# Patient Record
Sex: Female | Born: 1990 | Race: Black or African American | Hispanic: No | Marital: Married | Smoking: Never smoker
Health system: Southern US, Community
[De-identification: ages and names within clinical notes are randomized; demographics above are authoritative.]

## PROBLEM LIST (undated history)

## (undated) HISTORY — PX: LAPAROSCOPIC OOPHERECTOMY: SHX6507

---

## 2015-12-04 ENCOUNTER — Emergency Department
Admission: EM | Admit: 2015-12-04 | Discharge: 2015-12-04 | Disposition: A | Discharge status: Home / Self Care | Payer: Self-pay | Attending: Emergency Medicine | Admitting: Emergency Medicine

## 2015-12-04 ENCOUNTER — Encounter: Payer: Self-pay | Admitting: Emergency Medicine

## 2015-12-04 DIAGNOSIS — J029 Acute pharyngitis, unspecified: Secondary | ICD-10-CM | POA: Insufficient documentation

## 2015-12-04 LAB — POCT RAPID STREP A: Streptococcus, Group A Screen (Direct): NEGATIVE

## 2015-12-04 MED ORDER — IBUPROFEN 400 MG PO TABS
400.0000 mg | ORAL_TABLET | Freq: Once | ORAL | Status: AC
  Administered 2015-12-04: 400 mg via ORAL

## 2015-12-04 MED ORDER — IBUPROFEN 400 MG PO TABS: 400.0000 mg | ORAL_TABLET | Freq: Four times a day (QID) | ORAL | Status: DC | PRN

## 2015-12-04 MED ORDER — IBUPROFEN 400 MG PO TABS
ORAL_TABLET | ORAL | Status: AC
  Administered 2015-12-04: 400 mg via ORAL
  Filled 2015-12-04: qty 1

## 2015-12-04 NOTE — ED Notes (Addendum)
Patient ambulatory to triage with steady gait, without difficulty or distress noted; pt reports sore throat, chills today

## 2015-12-04 NOTE — ED Provider Notes (Signed)
Time Seen: Approximately 0 500  I have reviewed the triage notes  Chief Complaint: Sore Throat   History of Present Illness: Jennifer Wells is a 25 y.o. female who states she was at work this evening at the [employer] plant when she noticed some feelings of sore throat, chills, generalized body aches. She denies any nausea, vomiting, shortness of breath or productive cough. She states she had a subjective fever yesterday. Patient states there is a lot of flu going around her household etc. She's had a mild frontal headache without any blurred vision and loss of vision. She states it hurts to swallow and hurts to speak. He denies any pain in the neck region.   History reviewed. No pertinent past medical history.  There are no active problems to display for this patient.   Past Surgical History  Procedure Laterality Date  . Laparoscopic oopherectomy      Past Surgical History  Procedure Laterality Date  . Laparoscopic oopherectomy      Current Outpatient Rx  Name  Route  Sig  Dispense  Refill  . ibuprofen (ADVIL,MOTRIN) 400 MG tablet   Oral   Take 1 tablet (400 mg total) by mouth every 6 (six) hours as needed.   30 tablet   0     Allergies:  Zithromax  Family History: No family history on file.  Social History: Social History  Substance Use Topics  . Smoking status: Never Smoker   . Smokeless tobacco: None  . Alcohol Use: No     Review of Systems:   Constitutional: No current fever Eyes: No visual disturbances ENT: Sore throat on both sides with radiation up into both ears Cardiac: No chest pain Respiratory: No shortness of breath, wheezing, or stridor Abdomen: No abdominal pain, no vomiting, No diarrhea Extremities: No peripheral edema, cyanosis Skin: No rashes, easy bruising Neurologic: No focal weakness, trouble with speech or swollowing Urologic: No dysuria, Hematuria, or urinary frequency   Physical Exam:  ED Triage Vitals  Enc Vitals Group   BP 12/04/15 0157 122/57 mmHg     Pulse Rate 12/04/15 0157 66     Resp 12/04/15 0157 20     Temp 12/04/15 0157 98 F (36.7 C)     Temp Source 12/04/15 0157 Oral     SpO2 12/04/15 0157 98 %     Weight 12/04/15 0157 265 lb (120.203 kg)     Height 12/04/15 0157  (1.676 m)     Head Cir --      Peak Flow --      Pain Score 12/04/15 0156 8     Pain Loc --      Pain Edu? --      Excl. in GC? --     General: Awake , Alert , and Oriented times 3; GCS 15 patient speaks in a soft voice without any stridor or wheezing at the bedside no signs of respiratory distress Head: Normal cephalic , atraumatic Eyes: Pupils equal , round, reactive to light Nose/Throat: No nasal drainage, patent upper airway with some mild erythema across the soft palate with no visible abscesses more swollen tonsillar region. Uvula is midline. TMs are negative bilaterally for erythema, exudate  Neck: Supple, Full range of motion, No anterior adenopathy or palpable thyroid masses Lungs: Clear to ascultation without wheezes , rhonchi, or rales Heart: Regular rate, regular rhythm without murmurs , gallops , or rubs Abdomen: Soft, non tender without rebound, guarding , or rigidity; bowel sounds positive  and symmetric in all 4 quadrants. No organomegaly .        Extremities: 2 plus symmetric pulses. No edema, clubbing or cyanosis Neurologic: normal ambulation, Skin: warm, dry, no rashes   Labs:   All laboratory work was reviewed including any pertinent negatives or positives listed below:  Labs Reviewed  CULTURE, GROUP A STREP [facility])  POCT RAPID STREP A   rapid strep test is negative with stool culture pending ED Course: Patient appears to have an upper respiratory tract infection. She may have developing influenza. She is otherwise stable and I felt did not have any meningitis or any life-threatening cause for her presentation at this time. She was given prescription for ibuprofen and a work note and advised drink  plenty of fluids and that we would contact her for strep culture is positive. To have any signs consistent with mononucleosis.    Assessment: * Pharyngitis  Final Clinical Impression:   Final diagnoses:  Pharyngitis     Plan:  Outpatient management Patient was advised to return immediately if condition worsens. Patient was advised to follow up with their primary care physician or other specialized physicians involved in their outpatient care            Jennye Moccasin, MD 12/04/15 [phone]

## 2015-12-04 NOTE — Discharge Instructions (Signed)
Sore Throat A sore throat is a painful, burning, sore, or scratchy feeling of the throat. There may be pain or tenderness when swallowing or talking. You may have other symptoms with a sore throat. These include coughing, sneezing, fever, or a swollen neck. A sore throat is often the first sign of another sickness. These sicknesses may include a cold, flu, strep throat, or an infection called mono. Most sore throats go away without medical treatment.  HOME CARE   Only take medicine as told by your doctor.  Drink enough fluids to keep your pee (urine) clear or pale yellow.  Rest as needed.  Try using throat sprays, lozenges, or suck on hard candy (if older than 4 years or as told).  Sip warm liquids, such as broth, herbal tea, or warm water with honey. Try sucking on frozen ice pops or drinking cold liquids.  Rinse the mouth (gargle) with salt water. Mix 1 teaspoon salt with 8 ounces of water.  Do not smoke. Avoid being around others when they are smoking.  Put a humidifier in your bedroom at night to moisten the air. You can also turn on a hot shower and sit in the bathroom for 5-10 minutes. Be sure the bathroom door is closed. GET HELP RIGHT AWAY IF:   You have trouble breathing.  You cannot swallow fluids, soft foods, or your spit (saliva).  You have more puffiness (swelling) in the throat.  Your sore throat does not get better in 7 days.  You feel sick to your stomach (nauseous) and throw up (vomit).  You have a fever or lasting symptoms for more than 2-3 days.  You have a fever and your symptoms suddenly get worse. MAKE SURE YOU:   Understand these instructions.  Will watch your condition.  Will get help right away if you are not doing well or get worse.   This information is not intended to replace advice given to you by your health care provider. Make sure you discuss any questions you have with your health care provider.   Document Released: 06/29/2008 Document  Revised: 06/14/2012 Document Reviewed: 05/28/2012 [employer] Interactive Patient Education [employer]  Please return immediately if condition worsens. Please contact her primary physician or the physician you were given for referral. If you have any specialist physicians involved in her treatment and plan please also contact them. Thank you for using [facility].

## 2015-12-06 LAB — CULTURE, GROUP A STREP (THRC)

## 2016-01-12 ENCOUNTER — Emergency Department: Payer: Medicaid Other

## 2016-01-12 ENCOUNTER — Emergency Department
Admission: EM | Admit: 2016-01-12 | Discharge: 2016-01-12 | Disposition: A | Discharge status: Home / Self Care | Payer: Self-pay | Attending: Emergency Medicine | Admitting: Emergency Medicine

## 2016-01-12 DIAGNOSIS — Y999 Unspecified external cause status: Secondary | ICD-10-CM | POA: Insufficient documentation

## 2016-01-12 DIAGNOSIS — W19XXXA Unspecified fall, initial encounter: Secondary | ICD-10-CM

## 2016-01-12 DIAGNOSIS — Y939 Activity, unspecified: Secondary | ICD-10-CM | POA: Insufficient documentation

## 2016-01-12 DIAGNOSIS — Y929 Unspecified place or not applicable: Secondary | ICD-10-CM | POA: Insufficient documentation

## 2016-01-12 DIAGNOSIS — W108XXA Fall (on) (from) other stairs and steps, initial encounter: Secondary | ICD-10-CM | POA: Insufficient documentation

## 2016-01-12 DIAGNOSIS — M542 Cervicalgia: Secondary | ICD-10-CM

## 2016-01-12 DIAGNOSIS — S0083XA Contusion of other part of head, initial encounter: Secondary | ICD-10-CM | POA: Insufficient documentation

## 2016-01-12 MED ORDER — HYDROCODONE-ACETAMINOPHEN 5-325 MG PO TABS
1.0000 | ORAL_TABLET | Freq: Once | ORAL | Status: AC
Start: 2016-01-12 — End: 2016-01-12
  Administered 2016-01-12: 1 via ORAL
  Filled 2016-01-12: qty 1

## 2016-01-12 MED ORDER — ONDANSETRON 4 MG PO TBDP
4.0000 mg | ORAL_TABLET | Freq: Once | ORAL | Status: AC
Start: 2016-01-12 — End: 2016-01-12
  Administered 2016-01-12: 4 mg via ORAL
  Filled 2016-01-12: qty 1

## 2016-01-12 MED ORDER — IBUPROFEN 800 MG PO TABS: 800.0000 mg | ORAL_TABLET | Freq: Three times a day (TID) | ORAL | Status: DC | PRN

## 2016-01-12 MED ORDER — HYDROCODONE-ACETAMINOPHEN 5-325 MG PO TABS: 1.0000 | ORAL_TABLET | Freq: Four times a day (QID) | ORAL | Status: DC | PRN

## 2016-01-12 NOTE — Discharge Instructions (Signed)
1. You may take pain medicines as needed (Motrin/Norco #15). 2. Apply ice to affected area several times daily. 3. Return to the ER for worsening symptoms, persistent vomiting, difficult breathing or other concerns.  Facial or Scalp Contusion A facial or scalp contusion is a deep bruise on the face or head. Injuries to the face and head generally cause a lot of swelling, especially around the eyes. Contusions are the result of an injury that caused bleeding under the skin. The contusion may turn blue, purple, or yellow. Minor injuries will give you a painless contusion, but more severe contusions may stay painful and swollen for a few weeks.  CAUSES  A facial or scalp contusion is caused by a blunt injury or trauma to the face or head area.  SIGNS AND SYMPTOMS   Swelling of the injured area.   Discoloration of the injured area.   Tenderness, soreness, or pain in the injured area.  DIAGNOSIS  The diagnosis can be made by taking a medical history and doing a physical exam. An X-ray exam, CT scan, or MRI may be needed to determine if there are any associated injuries, such as broken bones (fractures). TREATMENT  Often, the best treatment for a facial or scalp contusion is applying cold compresses to the injured area. Over-the-counter medicines may also be recommended for pain control.  HOME CARE INSTRUCTIONS   Only take over-the-counter or prescription medicines as directed by your health care provider.   Apply ice to the injured area.   Put ice in a plastic bag.   Place a towel between your skin and the bag.   Leave the ice on for 20 minutes, 2-3 times a day.  SEEK MEDICAL CARE IF:  You have bite problems.   You have pain with chewing.   You are concerned about facial defects. SEEK IMMEDIATE MEDICAL CARE IF:  You have severe pain or a headache that is not relieved by medicine.   You have unusual sleepiness, confusion, or personality changes.   You throw up  (vomit).   You have a persistent nosebleed.   You have double vision or blurred vision.   You have fluid drainage from your nose or ear.   You have difficulty walking or using your arms or legs.  MAKE SURE YOU:   Understand these instructions.  Will watch your condition.  Will get help right away if you are not doing well or get worse.   This information is not intended to replace advice given to you by your health care provider. Make sure you discuss any questions you have with your health care provider.   Document Released: 10/28/2004 Document Revised: 10/11/2014 Document Reviewed: 05/03/2013 Elsevier Interactive Patient Education 2016 [employer]  Cryotherapy Cryotherapy means treatment with cold. Ice or gel packs can be used to reduce both pain and swelling. Ice is the most helpful within the first 24 to 48 hours after an injury or flare-up from overusing a muscle or joint. Sprains, strains, spasms, burning pain, shooting pain, and aches can all be eased with ice. Ice can also be used when recovering from surgery. Ice is effective, has very few side effects, and is safe for most people to use. PRECAUTIONS  Ice is not a safe treatment option for people with:  Raynaud phenomenon. This is a condition affecting small blood vessels in the extremities. Exposure to cold may cause your problems to return.  Cold hypersensitivity. There are many forms of cold hypersensitivity, including:  Cold urticaria.  Red, itchy hives appear on the skin when the tissues begin to warm after being iced.  Cold erythema. This is a red, itchy rash caused by exposure to cold.  Cold hemoglobinuria. Red blood cells break down when the tissues begin to warm after being iced. The hemoglobin that carry oxygen are passed into the urine because they cannot combine with blood proteins fast enough.  Numbness or altered sensitivity in the area being iced. If you have any of the following conditions, do not  use ice until you have discussed cryotherapy with your caregiver:  Heart conditions, such as arrhythmia, angina, or chronic heart disease.  High blood pressure.  Healing wounds or open skin in the area being iced.  Current infections.  Rheumatoid arthritis.  Poor circulation.  Diabetes. Ice slows the blood flow in the region it is applied. This is beneficial when trying to stop inflamed tissues from spreading irritating chemicals to surrounding tissues. However, if you expose your skin to cold temperatures for too long or without the proper protection, you can damage your skin or nerves. Watch for signs of skin damage due to cold. HOME CARE INSTRUCTIONS Follow these tips to use ice and cold packs safely.  Place a dry or damp towel between the ice and skin. A damp towel will cool the skin more quickly, so you may need to shorten the time that the ice is used.  For a more rapid response, add gentle compression to the ice.  Ice for no more than 10 to 20 minutes at a time. The bonier the area you are icing, the less time it will take to get the benefits of ice.  Check your skin after 5 minutes to make sure there are no signs of a poor response to cold or skin damage.  Rest 20 minutes or more between uses.  Once your skin is numb, you can end your treatment. You can test numbness by very lightly touching your skin. The touch should be so light that you do not see the skin dimple from the pressure of your fingertip. When using ice, most people will feel these normal sensations in this order: cold, burning, aching, and numbness.  Do not use ice on someone who cannot communicate their responses to pain, such as small children or people with dementia. HOW TO MAKE AN ICE PACK Ice packs are the most common way to use ice therapy. Other methods include ice massage, ice baths, and cryosprays. Muscle creams that cause a cold, tingly feeling do not offer the same benefits that ice offers and should  not be used as a substitute unless recommended by your caregiver. To make an ice pack, do one of the following:  Place crushed ice or a bag of frozen vegetables in a sealable plastic bag. Squeeze out the excess air. Place this bag inside another plastic bag. Slide the bag into a pillowcase or place a damp towel between your skin and the bag.  Mix 3 parts water with 1 part rubbing alcohol. Freeze the mixture in a sealable plastic bag. When you remove the mixture from the freezer, it will be slushy. Squeeze out the excess air. Place this bag inside another plastic bag. Slide the bag into a pillowcase or place a damp towel between your skin and the bag. SEEK MEDICAL CARE IF:  You develop white spots on your skin. This may give the skin a blotchy (mottled) appearance.  Your skin turns blue or pale.  Your skin becomes waxy  or hard.  Your swelling gets worse. MAKE SURE YOU:   Understand these instructions.  Will watch your condition.  Will get help right away if you are not doing well or get worse.   This information is not intended to replace advice given to you by your health care provider. Make sure you discuss any questions you have with your health care provider.   Document Released: 05/17/2011 Document Revised: 10/11/2014 Document Reviewed: 05/17/2011 [employer] Interactive Patient Education 2016 [employer]  Cervical Strain and Sprain With Rehab Cervical strain and sprain are injuries that commonly occur with "whiplash" injuries. Whiplash occurs when the neck is forcefully whipped backward or forward, such as during a motor vehicle accident or during contact sports. The muscles, ligaments, tendons, discs, and nerves of the neck are susceptible to injury when this occurs. RISK FACTORS Risk of having a whiplash injury increases if:  Osteoarthritis of the spine.  Situations that make head or neck accidents or trauma more likely.  High-risk sports (football, rugby, wrestling,  hockey, auto racing, gymnastics, diving, contact karate, or boxing).  Poor strength and flexibility of the neck.  Previous neck injury.  Poor tackling technique.  Improperly fitted or padded equipment. SYMPTOMS   Pain or stiffness in the front or back of neck or both.  Symptoms may present immediately or up to 24 hours after injury.  Dizziness, headache, nausea, and vomiting.  Muscle spasm with soreness and stiffness in the neck.  Tenderness and swelling at the injury site. PREVENTION  Learn and use proper technique (avoid tackling with the head, spearing, and head-butting; use proper falling techniques to avoid landing on the head).  Warm up and stretch properly before activity.  Maintain physical fitness:  Strength, flexibility, and endurance.  Cardiovascular fitness.  Wear properly fitted and padded protective equipment, such as padded soft collars, for participation in contact sports. PROGNOSIS  Recovery from cervical strain and sprain injuries is dependent on the extent of the injury. These injuries are usually curable in 1 week to 3 months with appropriate treatment.  RELATED COMPLICATIONS   Temporary numbness and weakness may occur if the nerve roots are damaged, and this may persist until the nerve has completely healed.  Chronic pain due to frequent recurrence of symptoms.  Prolonged healing, especially if activity is resumed too soon (before complete recovery). TREATMENT  Treatment initially involves the use of ice and medication to help reduce pain and inflammation. It is also important to perform strengthening and stretching exercises and modify activities that worsen symptoms so the injury does not get worse. These exercises may be performed at home or with a therapist. For patients who experience severe symptoms, a soft, padded collar may be recommended to be worn around the neck.  Improving your posture may help reduce symptoms. Posture improvement includes  pulling your chin and abdomen in while sitting or standing. If you are sitting, sit in a firm chair with your buttocks against the back of the chair. While sleeping, try replacing your pillow with a small towel rolled to 2 inches in diameter, or use a cervical pillow or soft cervical collar. Poor sleeping positions delay healing.  For patients with nerve root damage, which causes numbness or weakness, the use of a cervical traction apparatus may be recommended. Surgery is rarely necessary for these injuries. However, cervical strain and sprains that are present at birth (congenital) may require surgery. MEDICATION   If pain medication is necessary, nonsteroidal anti-inflammatory medications, such as aspirin and ibuprofen,  or other minor pain relievers, such as acetaminophen, are often recommended.  Do not take pain medication for 7 days before surgery.  Prescription pain relievers may be given if deemed necessary by your caregiver. Use only as directed and only as much as you need. HEAT AND COLD:   Cold treatment (icing) relieves pain and reduces inflammation. Cold treatment should be applied for 10 to 15 minutes every 2 to 3 hours for inflammation and pain and immediately after any activity that aggravates your symptoms. Use ice packs or an ice massage.  Heat treatment may be used prior to performing the stretching and strengthening activities prescribed by your caregiver, physical therapist, or athletic trainer. Use a heat pack or a warm soak. SEEK MEDICAL CARE IF:   Symptoms get worse or do not improve in 2 weeks despite treatment.  New, unexplained symptoms develop (drugs used in treatment may produce side effects). EXERCISES RANGE OF MOTION (ROM) AND STRETCHING EXERCISES - Cervical Strain and Sprain These exercises may help you when beginning to rehabilitate your injury. In order to successfully resolve your symptoms, you must improve your posture. These exercises are designed to help  reduce the forward-head and rounded-shoulder posture which contributes to this condition. Your symptoms may resolve with or without further involvement from your physician, physical therapist or athletic trainer. While completing these exercises, remember:   Restoring tissue flexibility helps normal motion to return to the joints. This allows healthier, less painful movement and activity.  An effective stretch should be held for at least 20 seconds, although you may need to begin with shorter hold times for comfort.  A stretch should never be painful. You should only feel a gentle lengthening or release in the stretched tissue. STRETCH- Axial Extensors  Lie on your back on the floor. You may bend your knees for comfort. Place a rolled-up hand towel or dish towel, about 2 inches in diameter, under the part of your head that makes contact with the floor.  Gently tuck your chin, as if trying to make a "double chin," until you feel a gentle stretch at the base of your head.  Hold __________ seconds. Repeat __________ times. Complete this exercise __________ times per day.  STRETCH - Axial Extension   Stand or sit on a firm surface. Assume a good posture: chest up, shoulders drawn back, abdominal muscles slightly tense, knees unlocked (if standing) and feet hip width apart.  Slowly retract your chin so your head slides back and your chin slightly lowers. Continue to look straight ahead.  You should feel a gentle stretch in the back of your head. Be certain not to feel an aggressive stretch since this can cause headaches later.  Hold for __________ seconds. Repeat __________ times. Complete this exercise __________ times per day. STRETCH - Cervical Side Bend   Stand or sit on a firm surface. Assume a good posture: chest up, shoulders drawn back, abdominal muscles slightly tense, knees unlocked (if standing) and feet hip width apart.  Without letting your nose or shoulders move, slowly tip your  right / left ear to your shoulder until your feel a gentle stretch in the muscles on the opposite side of your neck.  Hold __________ seconds. Repeat __________ times. Complete this exercise __________ times per day. STRETCH - Cervical Rotators   Stand or sit on a firm surface. Assume a good posture: chest up, shoulders drawn back, abdominal muscles slightly tense, knees unlocked (if standing) and feet hip width apart.  Keeping your  eyes level with the ground, slowly turn your head until you feel a gentle stretch along the back and opposite side of your neck.  Hold __________ seconds. Repeat __________ times. Complete this exercise __________ times per day. RANGE OF MOTION - Neck Circles   Stand or sit on a firm surface. Assume a good posture: chest up, shoulders drawn back, abdominal muscles slightly tense, knees unlocked (if standing) and feet hip width apart.  Gently roll your head down and around from the back of one shoulder to the back of the other. The motion should never be forced or painful.  Repeat the motion 10-20 times, or until you feel the neck muscles relax and loosen. Repeat __________ times. Complete the exercise __________ times per day. STRENGTHENING EXERCISES - Cervical Strain and Sprain These exercises may help you when beginning to rehabilitate your injury. They may resolve your symptoms with or without further involvement from your physician, physical therapist, or athletic trainer. While completing these exercises, remember:   Muscles can gain both the endurance and the strength needed for everyday activities through controlled exercises.  Complete these exercises as instructed by your physician, physical therapist, or athletic trainer. Progress the resistance and repetitions only as guided.  You may experience muscle soreness or fatigue, but the pain or discomfort you are trying to eliminate should never worsen during these exercises. If this pain does worsen, stop  and make certain you are following the directions exactly. If the pain is still present after adjustments, discontinue the exercise until you can discuss the trouble with your clinician. STRENGTH - Cervical Flexors, Isometric  Face a wall, standing about 6 inches away. Place a small pillow, a ball about 6-8 inches in diameter, or a folded towel between your forehead and the wall.  Slightly tuck your chin and gently push your forehead into the soft object. Push only with mild to moderate intensity, building up tension gradually. Keep your jaw and forehead relaxed.  Hold 10 to 20 seconds. Keep your breathing relaxed.  Release the tension slowly. Relax your neck muscles completely before you start the next repetition. Repeat __________ times. Complete this exercise __________ times per day. STRENGTH- Cervical Lateral Flexors, Isometric   Stand about 6 inches away from a wall. Place a small pillow, a ball about 6-8 inches in diameter, or a folded towel between the side of your head and the wall.  Slightly tuck your chin and gently tilt your head into the soft object. Push only with mild to moderate intensity, building up tension gradually. Keep your jaw and forehead relaxed.  Hold 10 to 20 seconds. Keep your breathing relaxed.  Release the tension slowly. Relax your neck muscles completely before you start the next repetition. Repeat __________ times. Complete this exercise __________ times per day. STRENGTH - Cervical Extensors, Isometric   Stand about 6 inches away from a wall. Place a small pillow, a ball about 6-8 inches in diameter, or a folded towel between the back of your head and the wall.  Slightly tuck your chin and gently tilt your head back into the soft object. Push only with mild to moderate intensity, building up tension gradually. Keep your jaw and forehead relaxed.  Hold 10 to 20 seconds. Keep your breathing relaxed.  Release the tension slowly. Relax your neck muscles  completely before you start the next repetition. Repeat __________ times. Complete this exercise __________ times per day. POSTURE AND BODY MECHANICS CONSIDERATIONS - Cervical Strain and Sprain Keeping correct posture when  sitting, standing or completing your activities will reduce the stress put on different body tissues, allowing injured tissues a chance to heal and limiting painful experiences. The following are general guidelines for improved posture. Your physician or physical therapist will provide you with any instructions specific to your needs. While reading these guidelines, remember:  The exercises prescribed by your provider will help you have the flexibility and strength to maintain correct postures.  The correct posture provides the optimal environment for your joints to work. All of your joints have less wear and tear when properly supported by a spine with good posture. This means you will experience a healthier, less painful body.  Correct posture must be practiced with all of your activities, especially prolonged sitting and standing. Correct posture is as important when doing repetitive low-stress activities (typing) as it is when doing a single heavy-load activity (lifting). PROLONGED STANDING WHILE SLIGHTLY LEANING FORWARD When completing a task that requires you to lean forward while standing in one place for a long time, place either foot up on a stationary 2- to 4-inch high object to help maintain the best posture. When both feet are on the ground, the low back tends to lose its slight inward curve. If this curve flattens (or becomes too large), then the back and your other joints will experience too much stress, fatigue more quickly, and can cause pain.  RESTING POSITIONS Consider which positions are most painful for you when choosing a resting position. If you have pain with flexion-based activities (sitting, bending, stooping, squatting), choose a position that allows you to  rest in a less flexed posture. You would want to avoid curling into a fetal position on your side. If your pain worsens with extension-based activities (prolonged standing, working overhead), avoid resting in an extended position such as sleeping on your stomach. Most people will find more comfort when they rest with their spine in a more neutral position, neither too rounded nor too arched. Lying on a non-sagging bed on your side with a pillow between your knees, or on your back with a pillow under your knees will often provide some relief. Keep in mind, being in any one position for a prolonged period of time, no matter how correct your posture, can still lead to stiffness. WALKING Walk with an upright posture. Your ears, shoulders, and hips should all line up. OFFICE WORK When working at a desk, create an environment that supports good, upright posture. Without extra support, muscles fatigue and lead to excessive strain on joints and other tissues. CHAIR:  A chair should be able to slide under your desk when your back makes contact with the back of the chair. This allows you to work closely.  The chair's height should allow your eyes to be level with the upper part of your monitor and your hands to be slightly lower than your elbows.  Body position:  Your feet should make contact with the floor. If this is not possible, use a foot rest.  Keep your ears over your shoulders. This will reduce stress on your neck and low back.   This information is not intended to replace advice given to you by your health care provider. Make sure you discuss any questions you have with your health care provider.   Document Released: 09/20/2005 Document Revised: 10/11/2014 Document Reviewed: 01/02/2009 [employer] Interactive Patient Education [employer]

## 2016-01-12 NOTE — ED Provider Notes (Signed)
[facility] Emergency Department Provider Note  ____________________________________________  Time seen: Approximately 2:56 AM  I have reviewed the triage vital signs and the nursing notes.   HISTORY  Chief Complaint Face pain   HPI Jennifer Wells is a 25 y.o. female who presents to the ED from home with a chief complain of fall, facial and neck pain.Patient reports she fell down 8-9 steps at approximately 7 PM. Denies LOC. Complains of left facial and neck pain. Denies associated symptoms of vision changes, chest pain, shortness of breath, abdominal pain, nausea, vomiting, diarrhea. Nothing makes her symptoms better or worse.   Past medical history None  There are no active problems to display for this patient.   Past Surgical History  Procedure Laterality Date  . Laparoscopic oopherectomy      Current Outpatient Rx  Name  Route  Sig  Dispense  Refill  . ibuprofen (ADVIL,MOTRIN) 400 MG tablet   Oral   Take 1 tablet (400 mg total) by mouth every 6 (six) hours as needed.   30 tablet   0     Allergies Zithromax  No family history on file.  Social History Social History  Substance Use Topics  . Smoking status: Never Smoker   . Smokeless tobacco: Not on file  . Alcohol Use: No    Review of Systems  Constitutional: No fever/chills. Eyes: No visual changes. ENT: Positive for facial pain. Positive for neck pain. No sore throat. Cardiovascular: Denies chest pain. Respiratory: Denies shortness of breath. Gastrointestinal: No abdominal pain.  No nausea, no vomiting.  No diarrhea.  No constipation. Genitourinary: Negative for dysuria. Musculoskeletal: Negative for back pain. Skin: Negative for rash. Neurological: Negative for headaches, focal weakness or numbness.  10-point ROS otherwise negative.  ____________________________________________   PHYSICAL EXAM:  VITAL SIGNS: ED Triage Vitals  Enc Vitals Group     BP 01/12/16  0205 125/73 mmHg     Pulse Rate 01/12/16 0205 60     Resp 01/12/16 0205 18     Temp 01/12/16 0205 98.5 F (36.9 C)     Temp Source 01/12/16 0205 Oral     SpO2 01/12/16 0205 100 %     Weight 01/12/16 0205 279 lb (126.554 kg)     Height 01/12/16 0205  (1.651 m)     Head Cir --      Peak Flow --      Pain Score 01/12/16 0206 9     Pain Loc --      Pain Edu? --      Excl. in GC? --     Constitutional: Alert and oriented. Well appearing and in no acute distress. Eyes: Conjunctivae are normal. PERRL. EOMI. Head: Left cheek contusion with mild swelling. Nose: No congestion/rhinnorhea. Mouth/Throat: No malocclusion. Mucous membranes are moist.  Oropharynx non-erythematous. Neck: No stridor.  Cervical spine tenderness to palpation. Cardiovascular: Normal rate, regular rhythm. Grossly normal heart sounds.  Good peripheral circulation. Respiratory: Normal respiratory effort.  No retractions. Lungs CTAB. Gastrointestinal: Soft and nontender. No distention. No abdominal bruits. No CVA tenderness. Musculoskeletal: No lower extremity tenderness nor edema.  No joint effusions. Neurologic:  Normal speech and language. No gross focal neurologic deficits are appreciated. No gait instability. Skin:  Skin is warm, dry and intact. No rash noted. Psychiatric: Mood and affect are normal. Speech and behavior are normal.  ____________________________________________   LABS (all labs ordered are listed, but only abnormal results are displayed)  Labs Reviewed - No data  to display ____________________________________________  EKG  None ____________________________________________  RADIOLOGY  CT Head/Cervical Spine/Maxillofacial interpreted per Dr. Karie KirksBloomer: Normal CT head.  Negative CT maxillofacial  Negative CT cervical spine ____________________________________________   PROCEDURES  Procedure(s) performed: None  Critical Care performed:  No  ____________________________________________   INITIAL IMPRESSION / ASSESSMENT AND PLAN / ED COURSE  Pertinent labs & imaging results that were available during my care of the patient were reviewed by me and considered in my medical decision making (see chart for details).  25 year old female who presents with left facial and neck pain s/p fall from steps. When directly questioned, patient denies assault or abuse. Will administer analgesia and obtain CT imaging of head, cervical spine and maxillofacial.  ----------------------------------------- 4:32 AM on 01/12/2016 -----------------------------------------  Patient feeling better. Updated her of the negative imaging studies. Strict return precautions given. Patient verbalizes understanding and agrees with plan of care. ____________________________________________   FINAL CLINICAL IMPRESSION(S) / ED DIAGNOSES  Final diagnoses:  Fall, initial encounter  Facial contusion, initial encounter  Cervical pain      Irean HongJade J Sung, MD 01/12/16 [phone]

## 2016-02-12 ENCOUNTER — Emergency Department: Payer: Medicaid Other

## 2016-02-12 ENCOUNTER — Emergency Department
Admission: EM | Admit: 2016-02-12 | Discharge: 2016-02-12 | Disposition: A | Discharge status: Home / Self Care | Payer: Medicaid Other | Attending: Emergency Medicine | Admitting: Emergency Medicine

## 2016-02-12 ENCOUNTER — Encounter: Payer: Self-pay | Admitting: Emergency Medicine

## 2016-02-12 DIAGNOSIS — M79671 Pain in right foot: Secondary | ICD-10-CM | POA: Insufficient documentation

## 2016-02-12 MED ORDER — MELOXICAM 15 MG PO TABS: 15.0000 mg | ORAL_TABLET | Freq: Every day | ORAL | Status: AC

## 2016-02-12 MED ORDER — IBUPROFEN 800 MG PO TABS
800.0000 mg | ORAL_TABLET | Freq: Once | ORAL | Status: AC
  Administered 2016-02-12: 800 mg via ORAL
  Filled 2016-02-12: qty 1

## 2016-02-12 MED ORDER — GABAPENTIN 100 MG PO CAPS: 100.0000 mg | ORAL_CAPSULE | Freq: Three times a day (TID) | ORAL | Status: AC

## 2016-02-12 NOTE — Discharge Instructions (Signed)
Musculoskeletal Pain Musculoskeletal pain is muscle and boney aches and pains. These pains can occur in any part of the body. Your caregiver may treat you without knowing the cause of the pain. They may treat you if blood or urine tests, X-rays, and other tests were normal.  CAUSES There is often not a definite cause or reason for these pains. These pains may be caused by a type of germ (virus). The discomfort may also come from overuse. Overuse includes working out too hard when your body is not fit. Boney aches also come from weather changes. Bone is sensitive to atmospheric pressure changes. HOME CARE INSTRUCTIONS   Ask when your test results will be ready. Make sure you get your test results.  Only take over-the-counter or prescription medicines for pain, discomfort, or fever as directed by your caregiver. If you were given medications for your condition, do not drive, operate machinery or power tools, or sign legal documents for 24 hours. Do not drink alcohol. Do not take sleeping pills or other medications that may interfere with treatment.  Continue all activities unless the activities cause more pain. When the pain lessens, slowly resume normal activities. Gradually increase the intensity and duration of the activities or exercise.  During periods of severe pain, bed rest may be helpful. Lay or sit in any position that is comfortable.  Putting ice on the injured area.  Put ice in a bag.  Place a towel between your skin and the bag.  Leave the ice on for 15 to 20 minutes, 3 to 4 times a day.  Follow up with your caregiver for continued problems and no reason can be found for the pain. If the pain becomes worse or does not go away, it may be necessary to repeat tests or do additional testing. Your caregiver may need to look further for a possible cause. SEEK IMMEDIATE MEDICAL CARE IF:  You have pain that is getting worse and is not relieved by medications.  You develop chest pain  that is associated with shortness or breath, sweating, feeling sick to your stomach (nauseous), or throw up (vomit).  Your pain becomes localized to the abdomen.  You develop any new symptoms that seem different or that concern you. MAKE SURE YOU:   Understand these instructions.  Will watch your condition.  Will get help right away if you are not doing well or get worse.   This information is not intended to replace advice given to you by your health care provider. Make sure you discuss any questions you have with your health care provider.   Document Released: 09/20/2005 Document Revised: 12/13/2011 Document Reviewed: 05/25/2013 Elsevier Interactive Patient Education 2016 Garrett therapy can help ease sore, stiff, injured, and tight muscles and joints. Heat relaxes your muscles, which may help ease your pain. Heat therapy should only be used on old, pre-existing, or long-lasting (chronic) injuries. Do not use heat therapy unless told by your doctor. HOW TO USE HEAT THERAPY There are several different kinds of heat therapy, including:  Moist heat pack.  Warm water bath.  Hot water bottle.  Electric heating pad.  Heated gel pack.  Heated wrap.  Electric heating pad. GENERAL HEAT THERAPY RECOMMENDATIONS   Do not sleep while using heat therapy. Only use heat therapy while you are awake.  Your skin may turn pink while using heat therapy. Do not use heat therapy if your skin turns red.  Do not use heat therapy if  you have new pain. °· High heat or long exposure to heat can cause burns. Be careful when using heat therapy to avoid burning your skin. °· Do not use heat therapy on areas of your skin that are already irritated, such as with a rash or sunburn. °GET HELP IF:  °· You have blisters, redness, swelling (puffiness), or numbness. °· You have new pain. °· Your pain is worse. °MAKE SURE YOU: °· Understand these instructions. °· Will watch your  condition. °· Will get help right away if you are not doing well or get worse. °  °This information is not intended to replace advice given to you by your health care provider. Make sure you discuss any questions you have with your health care provider. °  °Document Released: 12/13/2011 Document Revised: 10/11/2014 Document Reviewed: 11/13/2013 °Elsevier Interactive Patient Education ©2016 Elsevier Inc. ° °

## 2016-02-12 NOTE — ED Notes (Signed)
Sensation intact, pulses palpable, able to walk and wiggle toes.  NAD

## 2016-02-12 NOTE — ED Provider Notes (Signed)
[facility] Emergency Department Provider Note  ____________________________________________  Time seen: Approximately 9:23 PM  I have reviewed the triage vital signs and the nursing notes.   HISTORY  Chief Complaint Foot Pain    HPI Jennifer Wells is a 25 y.o. female who presents with complaints of right foot pain 2 weeks. Patient states that she's on her foot all day long and complaints at the right lateral aspect of her foot is numb and the inner aspect medially is painful. She reports that she'stried over-the-counter medication,. All of these were to no avail she still complained of foot pain. Concerned she might have a fracture or something else is going on. Describes her pain as 9 out of 10 nonradiating tried over-the-counter medication,   History reviewed. No pertinent past medical history.  There are no active problems to display for this patient.   Past Surgical History  Procedure Laterality Date  . Laparoscopic oopherectomy      Current Outpatient Rx  Name  Route  Sig  Dispense  Refill  . gabapentin (NEURONTIN) 100 MG capsule   Oral   Take 1 capsule (100 mg total) by mouth 3 (three) times daily.   30 capsule   0   . meloxicam (MOBIC) 15 MG tablet   Oral   Take 1 tablet (15 mg total) by mouth daily.   30 tablet   0     Allergies Zithromax  History reviewed. No pertinent family history.  Social History Social History  Substance Use Topics  . Smoking status: Never Smoker   . Smokeless tobacco: None  . Alcohol Use: No    Review of Systems Constitutional: No fever/chills Musculoskeletal: Positive for right foot pain Skin: Negative for rash. Neurological: Negative for headaches, focal weakness or numbness.  10-point ROS otherwise negative.  ____________________________________________   PHYSICAL EXAM:  VITAL SIGNS: ED Triage Vitals  Enc Vitals Group     BP 02/12/16 2112 115/60 mmHg     Pulse Rate 02/12/16 2112  62     Resp 02/12/16 2112 18     Temp 02/12/16 2112 98.5 F (36.9 C)     Temp Source 02/12/16 2112 Oral     SpO2 02/12/16 2112 99 %     Weight 02/12/16 2112 280 lb (127.007 kg)     Height 02/12/16 2112 5\' 5"  (1.651 m)     Head Cir --      Peak Flow --      Pain Score 02/12/16 2116 9     Pain Loc --      Pain Edu? --      Excl. in GC? --     Constitutional: Alert and oriented. Well appearing and in no acute distress. Musculoskeletal:Right foot with no ecchymosis edema or bruising noted. Patient states decreased sensation along the fifth digit both dorsally and plantar entirely. Complains of increased pain medially. Neurologic:  Normal speech and language. No gross focal neurologic deficits are appreciated. No gait instability. Skin:  Skin is warm, dry and intact. No rash noted. Psychiatric: Mood and affect are normal. Speech and behavior are normal.  ____________________________________________   LABS (all labs ordered are listed, but only abnormal results are displayed)  Labs Reviewed - No data to display ____________________________________________   RADIOLOGY  No acute osseous findings. ____________________________________________   PROCEDURES  Procedure(s) performed: None  Critical Care performed: No  ____________________________________________   INITIAL IMPRESSION / ASSESSMENT AND PLAN / ED COURSE  Pertinent labs & imaging results that  were available during my care of the patient were reviewed by me and considered in my medical decision making (see chart for details).  Acute nonspecific right foot pain. Referral to podiatry. Patient follow-up with PCP if needed Rx given for meloxicam and gabapentin. Work excuse 1 day given. ____________________________________________   FINAL CLINICAL IMPRESSION(S) / ED DIAGNOSES  Final diagnoses:  Foot pain, right     This chart was dictated using voice recognition software/Dragon. Despite best efforts to  proofread, errors can occur which can change the meaning. Any change was purely unintentional.   Evangeline Dakin, PA-C 02/12/16 2204  Jene Every, MD 02/12/16 2215

## 2016-02-12 NOTE — ED Notes (Signed)
Pt to ER with c/o left foot pain and numbness.  States works on her feet, denies known injury.

## 2017-05-03 IMAGING — CT CT HEAD W/O CM
4 of 8 series · 15 of 47 positions shown, 17 images · non-contrast
Comparison: None.

CLINICAL DATA: Fell dizzy and fell down 10 stairs last night,
hitting head. LEFT head and neck pain. History of childhood
seizures.

EXAM:
CT HEAD WITHOUT CONTRAST
CT MAXILLOFACIAL WITHOUT CONTRAST
CT CERVICAL SPINE WITHOUT CONTRAST
TECHNIQUE: Multidetector CT imaging of the head, cervical spine, and
maxillofacial structures were performed using the standard protocol
without intravenous contrast. Multiplanar CT image reconstructions
of the cervical spine and maxillofacial structures were also
generated.

[Series 7: soft tissue · axial · 0.29mm/px · z∈[-300,-278]mm · 2 of 85 slices shown]
[im 11/85  brain]
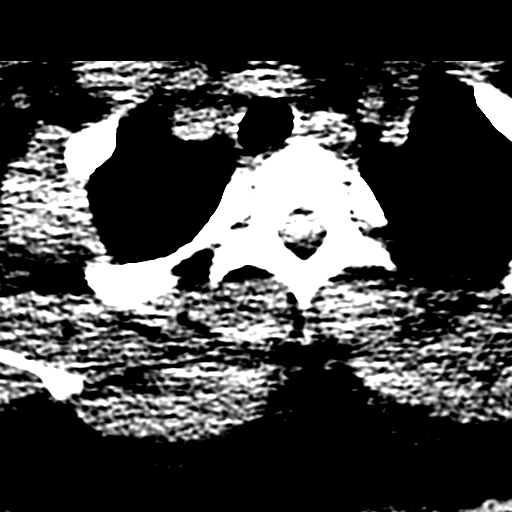
[im 22/85  brain]
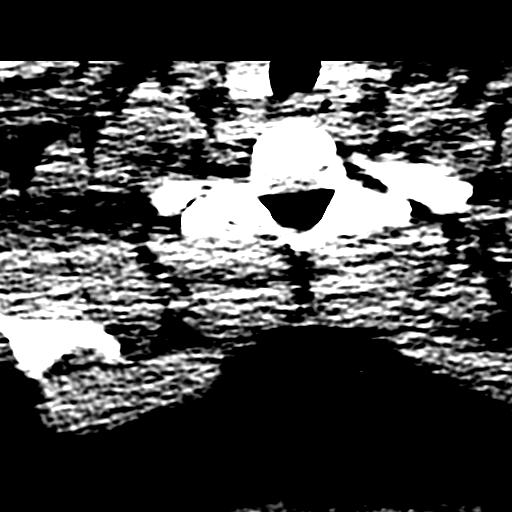

[Series 10: coronal soft · coronal · 0.32mm/px · 3 of 61 slices shown]
[im 13/61  brain]
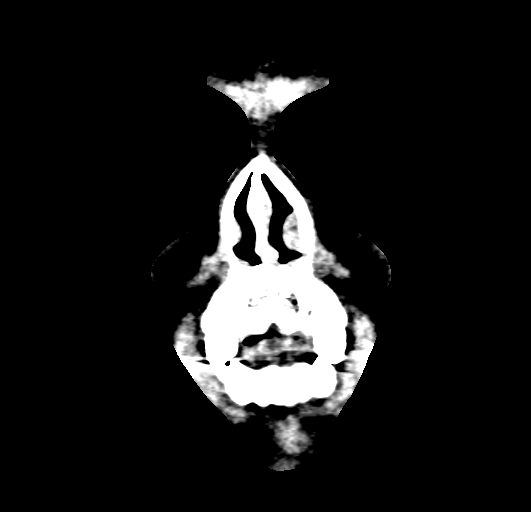
[im 25/61  brain]
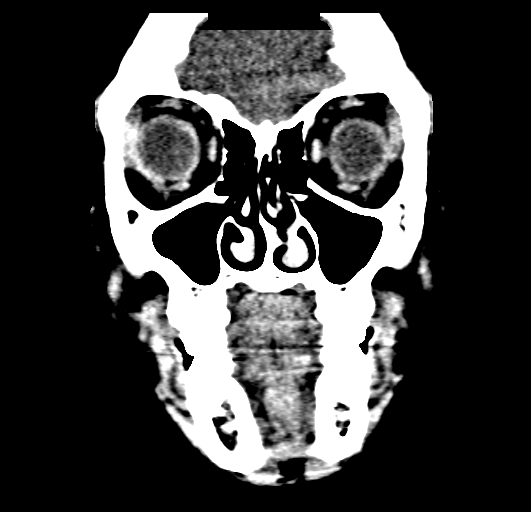
[im 37/61  brain]
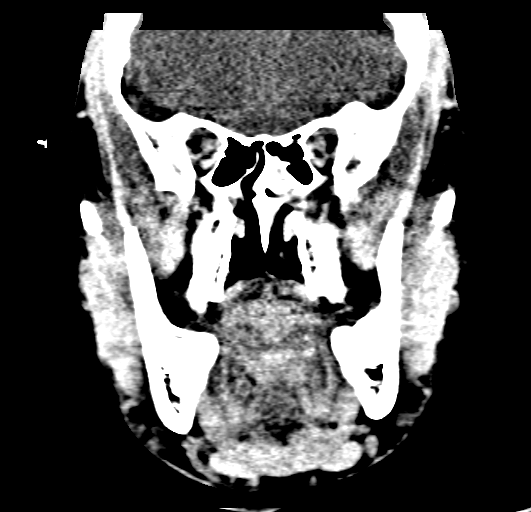

[Series 11: sagittal soft · sagittal · 0.27mm/px · 2 of 72 slices shown]
[im 24/72  brain]
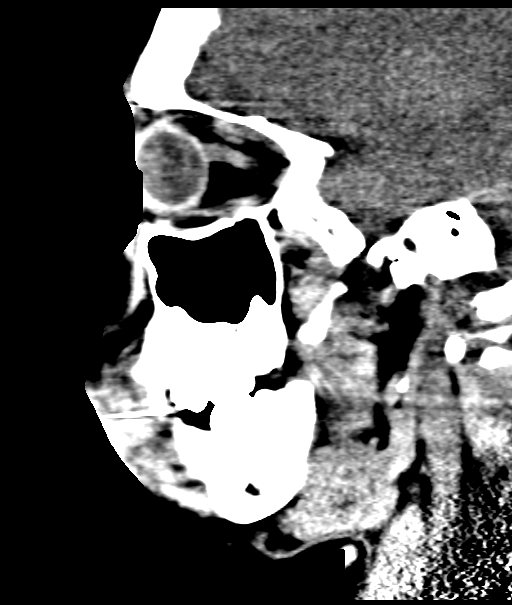
[im 48/72  brain]
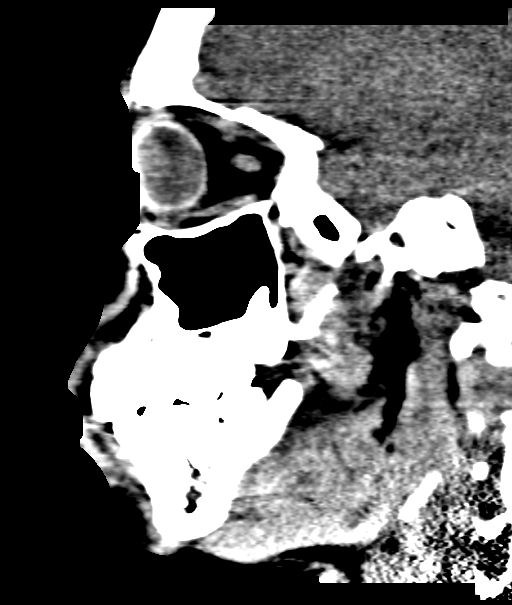

[Series 16: axial · axial · 0.21mm/px · z∈[-323,-186]mm · 8 of 91 slices shown, 10 images]
[im 11/91  brain]
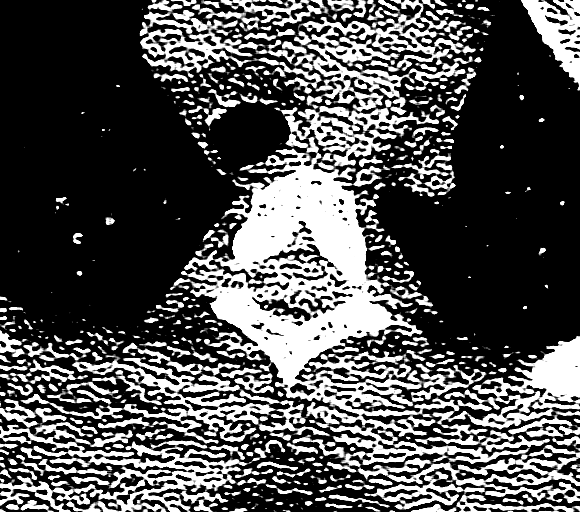
[im 11/91  bone]
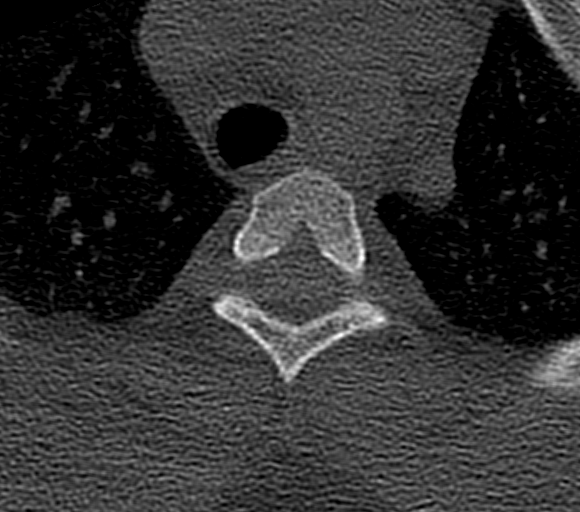
[im 21/91  brain]
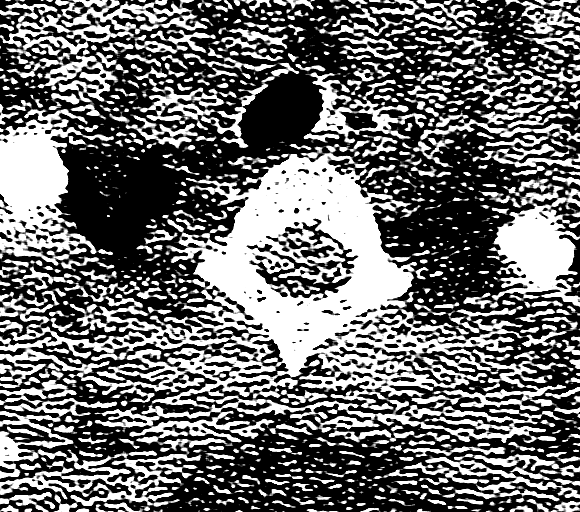
[im 31/91  brain]
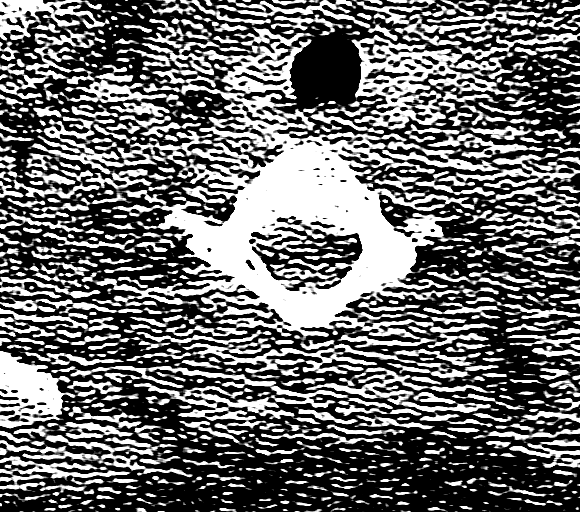
[im 41/91  brain]
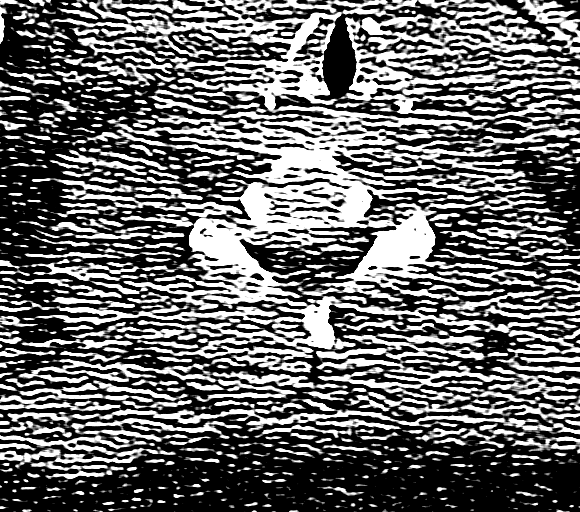
[im 51/91  brain]
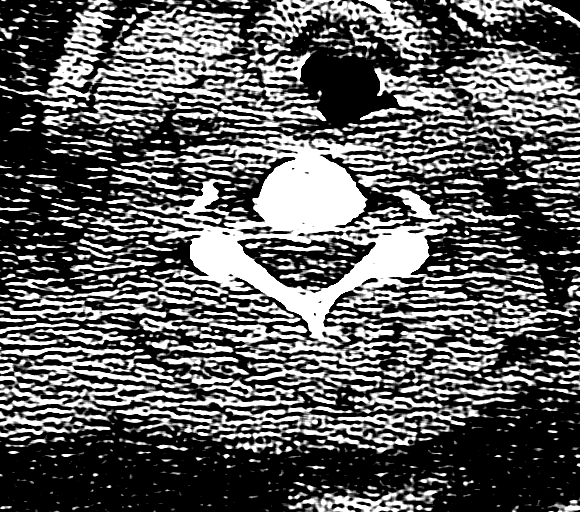
[im 51/91  bone]
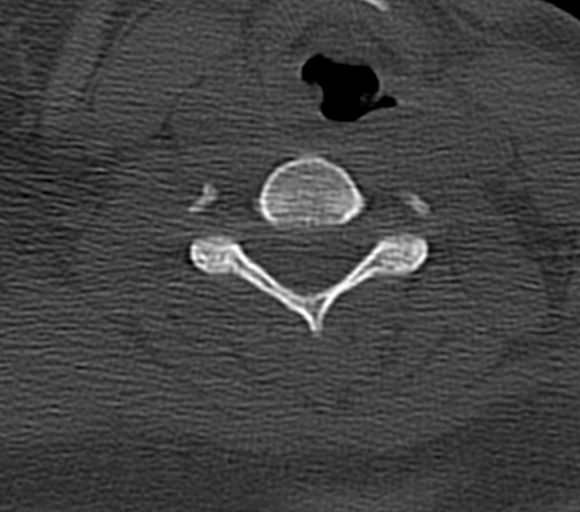
[im 61/91  brain]
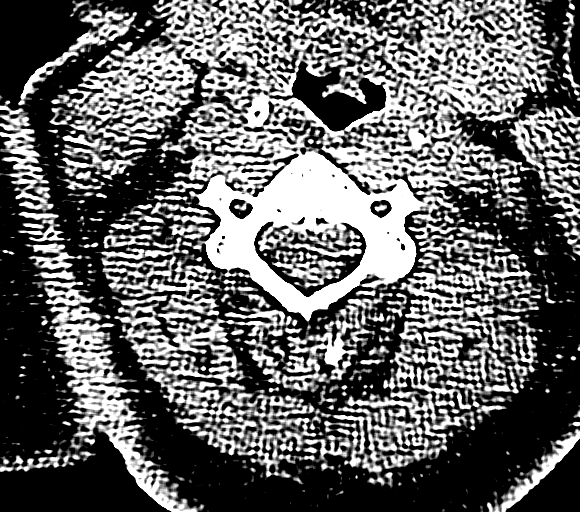
[im 71/91  brain]
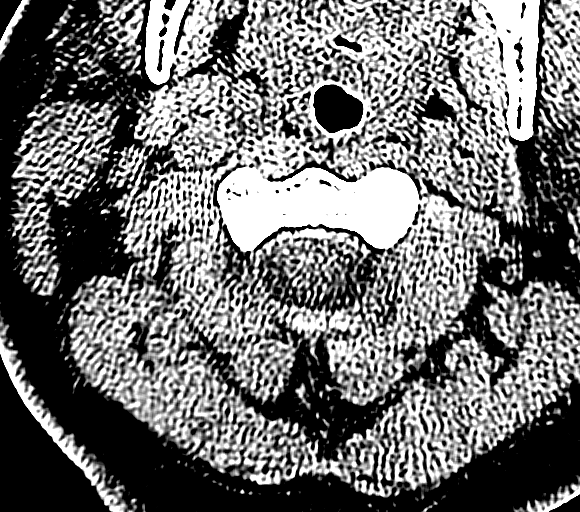
[im 81/91  brain]
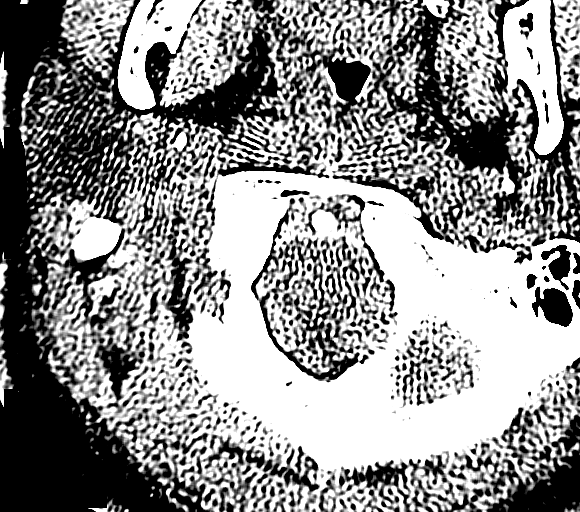

[15 of 47 positions shown; findings below may reference images not displayed]

FINDINGS: CT HEAD FINDINGS

INTRACRANIAL CONTENTS: The ventricles and sulci are normal. No
intraparenchymal hemorrhage, mass effect nor midline shift. No acute
large vascular territory infarcts. No abnormal extra-axial fluid
collections. Basal cisterns are patent. Fluid filled probable empty
sella.

SKULL/SOFT TISSUES: No skull fracture. No significant soft tissue
swelling.

CT MAXILLOFACIAL FINDINGS

FACIAL BONES: The mandible is intact, the condyles are located. No
acute facial fracture. No destructive bony lesions.

SINUSES: Small LEFT posterior ethmoid mucosal retention cyst without
paranasal sinus air-fluid levels. Mastoid air cells are well
aerated.

ORBITS: Ocular globes and orbital contents are unremarkable.

SOFT TISSUES: No significant soft tissue swelling. No subcutaneous
gas or radiopaque foreign bodies.

CT CERVICAL SPINE FINDINGS

Large body habitus results in overall noisy image quality.

OSSEOUS STRUCTURES: Cervical vertebral bodies and posterior elements
are intact and aligned with maintenance of the cervical lordosis.
Intervertebral disc heights preserved. No destructive bony lesions.
C1-2 articulation maintained.

SOFT TISSUES: Included prevertebral and paraspinal soft tissues are
unremarkable.
IMPRESSION: Normal CT head.

Negative CT maxillofacial

Negative CT cervical spine

## 2017-06-03 IMAGING — DX DG FOOT COMPLETE 3+V*R*
3 series · 3 of 3 positions shown · non-contrast
Comparison: None.

CLINICAL DATA: Pain medial left foot no known injury

EXAM:
RIGHT FOOT COMPLETE - 3+ VIEW

[foot ap]
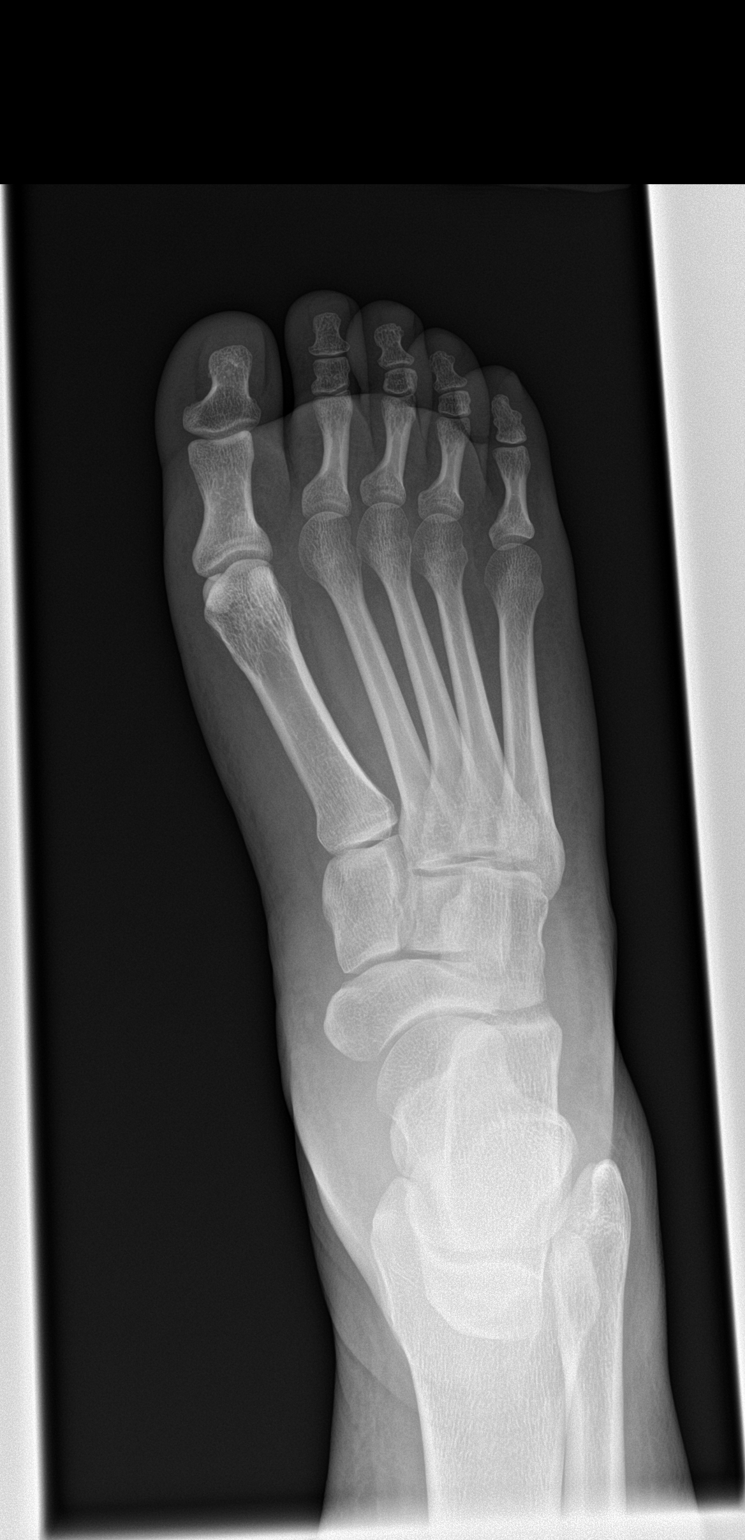

[foot obl]
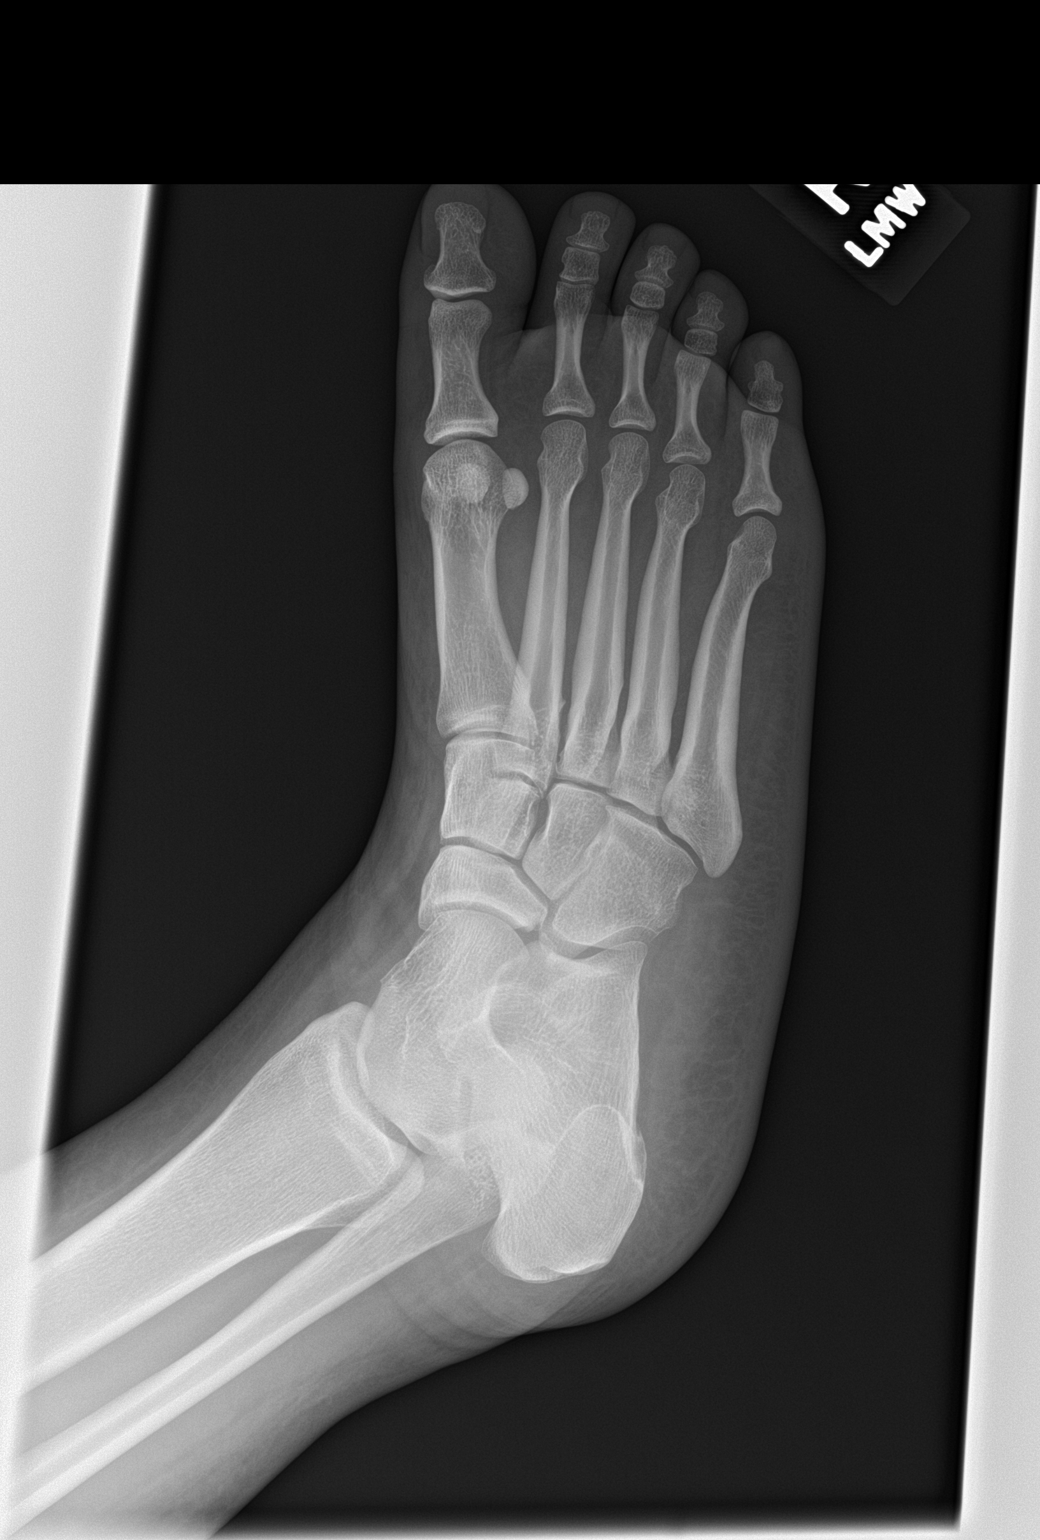

[foot lat]
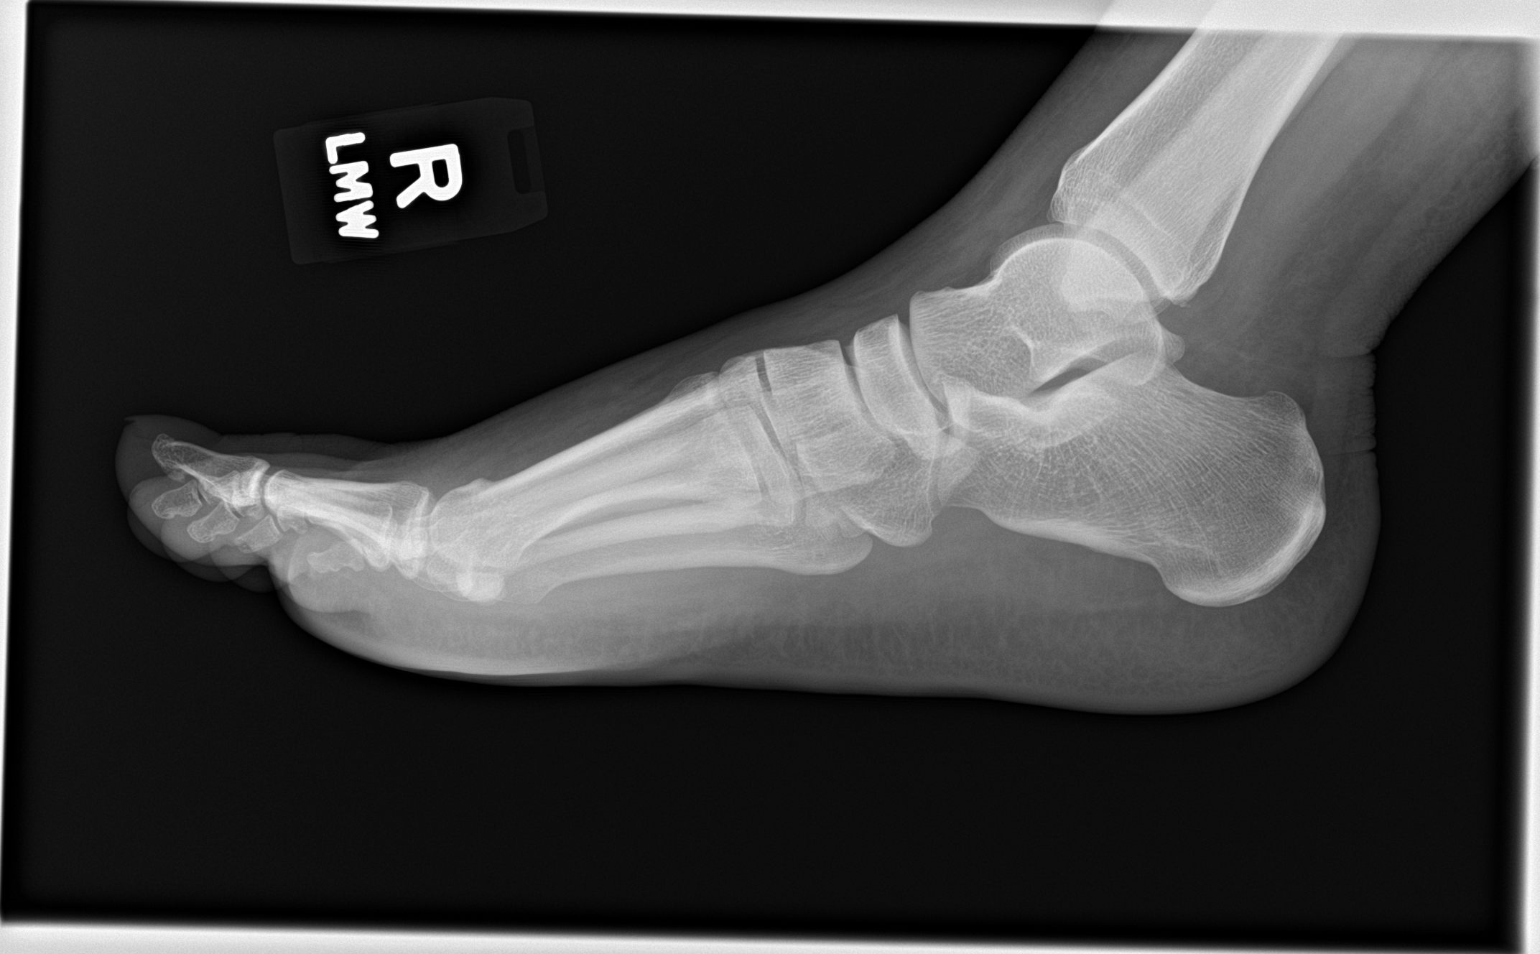

[3 of 3 positions shown; findings below may reference images not displayed]

FINDINGS: There is no evidence of fracture or dislocation. There is no
evidence of arthropathy or other focal bone abnormality. Soft
tissues are unremarkable.
IMPRESSION: Negative.
